# Patient Record
Sex: Male | Born: 2000 | Race: Black or African American | Hispanic: No | Marital: Single | Smoking: Never smoker
Health system: Southern US, Community
[De-identification: ages and names within clinical notes are randomized; demographics above are authoritative.]

## PROBLEM LIST (undated history)

## (undated) DIAGNOSIS — S02609A Fracture of mandible, unspecified, initial encounter for closed fracture: Secondary | ICD-10-CM

---
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
